# Patient Record
Sex: Female | Born: 1965 | Race: White | Hispanic: No | Marital: Married | Smoking: Never smoker
Health system: Southern US, Community
[De-identification: ages and names within clinical notes are randomized; demographics above are authoritative.]

## PROBLEM LIST (undated history)

## (undated) DIAGNOSIS — Z9289 Personal history of other medical treatment: Secondary | ICD-10-CM

## (undated) DIAGNOSIS — F419 Anxiety disorder, unspecified: Secondary | ICD-10-CM

## (undated) DIAGNOSIS — E7800 Pure hypercholesterolemia, unspecified: Secondary | ICD-10-CM

## (undated) DIAGNOSIS — Z803 Family history of malignant neoplasm of breast: Secondary | ICD-10-CM

## (undated) DIAGNOSIS — N200 Calculus of kidney: Secondary | ICD-10-CM

## (undated) DIAGNOSIS — D649 Anemia, unspecified: Secondary | ICD-10-CM

## (undated) DIAGNOSIS — J189 Pneumonia, unspecified organism: Secondary | ICD-10-CM

## (undated) DIAGNOSIS — I10 Essential (primary) hypertension: Secondary | ICD-10-CM

## (undated) HISTORY — PX: WISDOM TOOTH EXTRACTION: SHX21

## (undated) HISTORY — DX: Anxiety disorder, unspecified: F41.9

## (undated) HISTORY — DX: Pure hypercholesterolemia, unspecified: E78.00

## (undated) HISTORY — DX: Family history of malignant neoplasm of breast: Z80.3

## (undated) HISTORY — DX: Anemia, unspecified: D64.9

## (undated) HISTORY — DX: Calculus of kidney: N20.0

## (undated) HISTORY — DX: Pneumonia, unspecified organism: J18.9

## (undated) HISTORY — DX: Personal history of other medical treatment: Z92.89

---

## 2004-09-18 ENCOUNTER — Ambulatory Visit: Payer: Self-pay

## 2005-09-18 ENCOUNTER — Emergency Department: Payer: Self-pay | Admitting: Emergency Medicine

## 2005-09-23 ENCOUNTER — Ambulatory Visit: Payer: Self-pay

## 2006-01-11 ENCOUNTER — Emergency Department: Payer: Self-pay | Admitting: Emergency Medicine

## 2006-10-21 ENCOUNTER — Ambulatory Visit: Payer: Self-pay

## 2007-11-03 ENCOUNTER — Ambulatory Visit: Payer: Self-pay

## 2008-01-03 ENCOUNTER — Ambulatory Visit: Payer: Self-pay | Admitting: Family Medicine

## 2008-11-07 ENCOUNTER — Ambulatory Visit: Payer: Self-pay

## 2009-11-08 ENCOUNTER — Ambulatory Visit: Payer: Self-pay

## 2010-12-03 ENCOUNTER — Ambulatory Visit: Payer: Self-pay

## 2011-12-11 ENCOUNTER — Ambulatory Visit: Payer: Self-pay

## 2013-01-29 ENCOUNTER — Emergency Department: Payer: Self-pay | Admitting: Emergency Medicine

## 2013-01-29 LAB — COMPREHENSIVE METABOLIC PANEL
ALT: 20 U/L (ref 12–78)
AST: 14 U/L — AB (ref 15–37)
Albumin: 3.9 g/dL (ref 3.4–5.0)
Alkaline Phosphatase: 101 U/L
Anion Gap: 4 — ABNORMAL LOW (ref 7–16)
BUN: 10 mg/dL (ref 7–18)
Bilirubin,Total: 0.5 mg/dL (ref 0.2–1.0)
Calcium, Total: 9.1 mg/dL (ref 8.5–10.1)
Chloride: 105 mmol/L (ref 98–107)
Co2: 29 mmol/L (ref 21–32)
Creatinine: 0.66 mg/dL (ref 0.60–1.30)
EGFR (Non-African Amer.): >60
GLUCOSE: 158 mg/dL — AB (ref 65–99)
OSMOLALITY: 278 (ref 275–301)
POTASSIUM: 3.6 mmol/L (ref 3.5–5.1)
Sodium: 138 mmol/L (ref 136–145)
Total Protein: 7.7 g/dL (ref 6.4–8.2)

## 2013-01-29 LAB — URINALYSIS, COMPLETE
BACTERIA: NONE SEEN
BLOOD: NEGATIVE
Bilirubin,UR: NEGATIVE
Glucose,UR: NEGATIVE mg/dL (ref 0–75)
Ketone: NEGATIVE
Leukocyte Esterase: NEGATIVE
Nitrite: NEGATIVE
Ph: 7 (ref 4.5–8.0)
Protein: NEGATIVE
Specific Gravity: 1.001 (ref 1.003–1.030)
Squamous Epithelial: 3
WBC UR: NONE SEEN /HPF (ref 0–5)

## 2013-01-29 LAB — CBC
HCT: 39.5 % (ref 35.0–47.0)
HGB: 13.6 g/dL (ref 12.0–16.0)
MCH: 31.1 pg (ref 26.0–34.0)
MCHC: 34.3 g/dL (ref 32.0–36.0)
MCV: 91 fL (ref 80–100)
Platelet: 427 10*3/uL (ref 150–440)
RBC: 4.36 10*6/uL (ref 3.80–5.20)
RDW: 12.9 % (ref 11.5–14.5)
WBC: 9.8 10*3/uL (ref 3.6–11.0)

## 2013-01-29 LAB — LIPASE, BLOOD: Lipase: 194 U/L (ref 73–393)

## 2013-11-14 ENCOUNTER — Ambulatory Visit: Payer: Self-pay

## 2014-11-13 ENCOUNTER — Other Ambulatory Visit: Payer: Self-pay | Admitting: Certified Nurse Midwife

## 2014-11-13 DIAGNOSIS — Z1231 Encounter for screening mammogram for malignant neoplasm of breast: Secondary | ICD-10-CM

## 2014-11-16 ENCOUNTER — Ambulatory Visit
Admission: RE | Admit: 2014-11-16 | Discharge: 2014-11-16 | Disposition: A | Discharge status: Home / Self Care | Payer: BC Managed Care – PPO | Source: Ambulatory Visit | Attending: Certified Nurse Midwife | Admitting: Certified Nurse Midwife

## 2014-11-16 DIAGNOSIS — Z1213 Encounter for screening for malignant neoplasm of small intestine: Secondary | ICD-10-CM | POA: Insufficient documentation

## 2014-11-16 DIAGNOSIS — Z1231 Encounter for screening mammogram for malignant neoplasm of breast: Secondary | ICD-10-CM

## 2015-01-20 ENCOUNTER — Emergency Department: Payer: BC Managed Care – PPO

## 2015-01-20 ENCOUNTER — Emergency Department
Admission: EM | Admit: 2015-01-20 | Discharge: 2015-01-20 | Disposition: A | Discharge status: Home / Self Care | Payer: BC Managed Care – PPO | Attending: Emergency Medicine | Admitting: Emergency Medicine

## 2015-01-20 ENCOUNTER — Encounter: Payer: Self-pay | Admitting: *Deleted

## 2015-01-20 DIAGNOSIS — F17200 Nicotine dependence, unspecified, uncomplicated: Secondary | ICD-10-CM | POA: Diagnosis not present

## 2015-01-20 DIAGNOSIS — Z880 Allergy status to penicillin: Secondary | ICD-10-CM | POA: Insufficient documentation

## 2015-01-20 DIAGNOSIS — Z3202 Encounter for pregnancy test, result negative: Secondary | ICD-10-CM | POA: Diagnosis not present

## 2015-01-20 DIAGNOSIS — R109 Unspecified abdominal pain: Secondary | ICD-10-CM | POA: Diagnosis present

## 2015-01-20 DIAGNOSIS — Z79899 Other long term (current) drug therapy: Secondary | ICD-10-CM | POA: Insufficient documentation

## 2015-01-20 DIAGNOSIS — J189 Pneumonia, unspecified organism: Secondary | ICD-10-CM

## 2015-01-20 DIAGNOSIS — Z792 Long term (current) use of antibiotics: Secondary | ICD-10-CM | POA: Insufficient documentation

## 2015-01-20 DIAGNOSIS — N23 Unspecified renal colic: Secondary | ICD-10-CM | POA: Diagnosis not present

## 2015-01-20 DIAGNOSIS — Z793 Long term (current) use of hormonal contraceptives: Secondary | ICD-10-CM | POA: Insufficient documentation

## 2015-01-20 DIAGNOSIS — J159 Unspecified bacterial pneumonia: Secondary | ICD-10-CM | POA: Insufficient documentation

## 2015-01-20 DIAGNOSIS — R52 Pain, unspecified: Secondary | ICD-10-CM

## 2015-01-20 LAB — CBC
HEMATOCRIT: 38.8 % (ref 35.0–47.0)
Hemoglobin: 13.2 g/dL (ref 12.0–16.0)
MCH: 30.1 pg (ref 26.0–34.0)
MCHC: 33.9 g/dL (ref 32.0–36.0)
MCV: 88.8 fL (ref 80.0–100.0)
Platelets: 428 10*3/uL (ref 150–440)
RBC: 4.37 MIL/uL (ref 3.80–5.20)
RDW: 13.4 % (ref 11.5–14.5)
WBC: 13.1 10*3/uL — ABNORMAL HIGH (ref 3.6–11.0)

## 2015-01-20 LAB — URINALYSIS COMPLETE WITH MICROSCOPIC (ARMC ONLY)
BACTERIA UA: NONE SEEN
Bilirubin Urine: NEGATIVE
Glucose, UA: NEGATIVE mg/dL
Ketones, ur: NEGATIVE mg/dL
LEUKOCYTES UA: NEGATIVE
Nitrite: NEGATIVE
PH: 6 (ref 5.0–8.0)
Protein, ur: 30 mg/dL — AB
Specific Gravity, Urine: 1.018 (ref 1.005–1.030)

## 2015-01-20 LAB — COMPREHENSIVE METABOLIC PANEL
ALBUMIN: 4.2 g/dL (ref 3.5–5.0)
ALK PHOS: 78 U/L (ref 38–126)
ALT: 27 U/L (ref 14–54)
AST: 21 U/L (ref 15–41)
Anion gap: 10 (ref 5–15)
BUN: 12 mg/dL (ref 6–20)
CHLORIDE: 107 mmol/L (ref 101–111)
CO2: 22 mmol/L (ref 22–32)
Calcium: 9.1 mg/dL (ref 8.9–10.3)
Creatinine, Ser: 0.68 mg/dL (ref 0.44–1.00)
GFR calc non Af Amer: >60 mL/min (ref >60)
GLUCOSE: 165 mg/dL — AB (ref 65–99)
POTASSIUM: 3.8 mmol/L (ref 3.5–5.1)
SODIUM: 139 mmol/L (ref 135–145)
Total Bilirubin: 0.9 mg/dL (ref 0.3–1.2)
Total Protein: 7.3 g/dL (ref 6.5–8.1)

## 2015-01-20 LAB — PREGNANCY, URINE: PREG TEST UR: NEGATIVE

## 2015-01-20 MED ORDER — HYDROMORPHONE HCL 1 MG/ML IJ SOLN
1.0000 mg | Freq: Once | INTRAMUSCULAR | Status: AC
  Administered 2015-01-20: 1 mg via INTRAVENOUS
  Filled 2015-01-20: qty 1

## 2015-01-20 MED ORDER — SODIUM CHLORIDE 0.9 % IV BOLUS (SEPSIS)
1000.0000 mL | Freq: Once | INTRAVENOUS | Status: AC
  Administered 2015-01-20: 1000 mL via INTRAVENOUS

## 2015-01-20 MED ORDER — ONDANSETRON HCL 4 MG/2ML IJ SOLN
4.0000 mg | Freq: Once | INTRAMUSCULAR | Status: AC
  Administered 2015-01-20: 4 mg via INTRAVENOUS

## 2015-01-20 MED ORDER — ONDANSETRON HCL 4 MG/2ML IJ SOLN
INTRAMUSCULAR | Status: AC
  Administered 2015-01-20: 4 mg via INTRAVENOUS
  Filled 2015-01-20: qty 2

## 2015-01-20 MED ORDER — HYDROMORPHONE HCL 1 MG/ML IJ SOLN
0.5000 mg | Freq: Once | INTRAMUSCULAR | Status: AC
  Administered 2015-01-20: 0.5 mg via INTRAVENOUS

## 2015-01-20 MED ORDER — LEVOFLOXACIN 500 MG PO TABS: 500.0000 mg | ORAL_TABLET | Freq: Every day | ORAL | Status: AC

## 2015-01-20 MED ORDER — KETOROLAC TROMETHAMINE 30 MG/ML IJ SOLN
INTRAMUSCULAR | Status: AC
  Filled 2015-01-20: qty 1

## 2015-01-20 MED ORDER — OXYCODONE-ACETAMINOPHEN 5-325 MG PO TABS
2.0000 | ORAL_TABLET | Freq: Once | ORAL | Status: AC
  Administered 2015-01-20: 2 via ORAL
  Filled 2015-01-20: qty 2

## 2015-01-20 MED ORDER — HYDROMORPHONE HCL 1 MG/ML IJ SOLN
INTRAMUSCULAR | Status: AC
  Administered 2015-01-20: 0.5 mg via INTRAVENOUS
  Filled 2015-01-20: qty 1

## 2015-01-20 MED ORDER — OXYCODONE-ACETAMINOPHEN 5-325 MG PO TABS: 1.0000 | ORAL_TABLET | ORAL | Status: DC | PRN

## 2015-01-20 NOTE — Discharge Instructions (Signed)

## 2015-01-20 NOTE — ED Provider Notes (Signed)
[facility] Emergency Department Provider Note  ____________________________________________  Time seen: Approximately 6:39 AM  I have reviewed the triage vital signs and the nursing notes.   HISTORY  Chief Complaint Flank Pain    HPI Dawn Shelton is a 49 y.o. female patient has a history of kidney stones. Patient was woken from sleep left flank pain radiating from the CVA area down toward the groin consistent with her previous kidney stones. The pain is severe. Patient is nauseated from the pain. Denied any fever.  No past medical history on file.  There are no active problems to display for this patient.   No past surgical history on file.  Current Outpatient Rx  Name  Route  Sig  Dispense  Refill  . acetaminophen (TYLENOL) 500 MG tablet   Oral   Take 500 mg by mouth every 6 (six) hours as needed for mild pain or fever.          . cetirizine (ZYRTEC) 10 MG tablet   Oral   Take 10 mg by mouth daily.         . ferrous sulfate 325 (65 FE) MG tablet   Oral   Take 325 mg by mouth every other day.         . ibuprofen (ADVIL,MOTRIN) 200 MG tablet   Oral   Take 200 mg by mouth every 6 (six) hours as needed for headache.         . levonorgestrel-ethinyl estradiol (NORDETTE) 0.15-30 MG-MCG tablet   Oral   Take 1 tablet by mouth daily.         Marland Kitchen. PARoxetine (PAXIL) 20 MG tablet   Oral   Take 20 mg by mouth daily.         . vitamin B-12 (CYANOCOBALAMIN) 1000 MCG tablet   Oral   Take 1,000 mcg by mouth every other day.         . levofloxacin (LEVAQUIN) 500 MG tablet   Oral   Take 1 tablet (500 mg total) by mouth daily.   7 tablet   0   . oxyCODONE-acetaminophen (ROXICET) 5-325 MG tablet   Oral   Take 1 tablet by mouth every 4 (four) hours as needed for severe pain.   20 tablet   0     Allergies Amoxicillin and Penicillins  Family History  Problem Relation Age of Onset  . Breast cancer Mother [id number]  . Breast cancer  Maternal Aunt [id number]    Social History Social History  Substance Use Topics  . Smoking status: Current Every Day Smoker  . Smokeless tobacco: Not on file  . Alcohol Use: Yes    Review of Systems Constitutional: No fever/chills Eyes: No visual changes. ENT: No sore throat. Cardiovascular: Denies chest pain. Respiratory: Denies shortness of breath. Gastrointestinal: See history of present illness Genitourinary: Negative for dysuria. Musculoskeletal: Negative for back pain. Skin: Negative for rash. Neurological: Negative for headaches, focal weakness or numbness.  10-point ROS otherwise negative.  ____________________________________________   PHYSICAL EXAM:  VITAL SIGNS: ED Triage Vitals  Enc Vitals Group     BP 01/20/15 0449 178/88 mmHg     Pulse Rate 01/20/15 0449 83     Resp 01/20/15 0449 22     Temp 01/20/15 0449 98.3 F (36.8 C)     Temp Source 01/20/15 0449 Oral     SpO2 01/20/15 0449 99 %     Weight 01/20/15 0449 129 lb (58.514 kg)  Height 01/20/15 0449 5' (1.524 m)     Head Cir --      Peak Flow --      Pain Score 01/20/15 0450 10     Pain Loc --      Pain Edu? --      Excl. in GC? --    Constitutional: Alert and oriented. In considerable pain Eyes: Conjunctivae are normal. PERRL. EOMI. Head: Atraumatic. Nose: No congestion/rhinnorhea. Mouth/Throat: Mucous membranes are moist.  Oropharynx non-erythematous. Neck: No stridor.   Cardiovascular: Normal rate, regular rhythm. Grossly normal heart sounds.  Good peripheral circulation. Respiratory: Normal respiratory effort.  No retractions. Lungs CTAB. Gastrointestinal: Soft some tenderness in the left mid abdomen.. No distention. No abdominal bruits. Left CVA tenderness. Musculoskeletal: No lower extremity tenderness nor edema.  No joint effusions. Neurologic:  Normal speech and language. No gross focal neurologic deficits are appreciated. No gait instability. Skin:  Skin is warm, dry and intact. No rash  noted. Psychiatric: Mood and affect are normal. Speech and behavior are normal.  ____________________________________________   LABS (all labs ordered are listed, but only abnormal results are displayed)  Labs Reviewed  COMPREHENSIVE METABOLIC PANEL - Abnormal; Notable for the following:    Glucose, Bld 165 (*)    All other components within normal limits  CBC - Abnormal; Notable for the following:    WBC 13.1 (*)    All other components within normal limits  URINALYSIS COMPLETEWITH MICROSCOPIC ([facility] ONLY) - Abnormal; Notable for the following:    Color, Urine YELLOW (*)    APPearance HAZY (*)    Hgb urine dipstick 3+ (*)    Protein, ur 30 (*)    Squamous Epithelial / LPF 6-30 (*)    All other components within normal limits  PREGNANCY, URINE   ____________________________________________  EKG    ____________________________________________  RADIOLOGY CT shows a 4 mm stone at the UVJ. There is also one other stone in the left kidney. There is an infiltrate in the bottom of the left lung on the CT. Per radiology. Chest x-ray is also read as reticular Angela pattern consistent with pneumonia or bronchiolitis by the radiologist. I reviewed both the CT and the chest x-ray   ____________________________________________   PROCEDURES  Patient gets half a milligram and I wanted which helped slightly in the gets another milligram of Dilaudid which helps a lot .  I discussed the CT and chest x-ray reports with the patient. She will follow-up with both urology and her family doctor. She is aware of the use of the Percocet and Levaquin.  ____________________________________________   INITIAL IMPRESSION / ASSESSMENT AND PLAN / ED COURSE  Pertinent labs & imaging results that were available during my care of the patient were reviewed by me and considered in my medical decision making (see chart for details).  ____________________________________________   FINAL CLINICAL  IMPRESSION(S) / ED DIAGNOSES  Final diagnoses:  Pain  Renal colic  Community acquired pneumonia      Arnaldo Natal, MD 01/20/15 [phone]

## 2015-01-20 NOTE — ED Notes (Signed)
Lab notified to add urine pregnancy. 

## 2015-01-20 NOTE — ED Notes (Signed)
Pt reports pain 2/10 at this time.

## 2015-01-20 NOTE — ED Notes (Signed)
Pt to triage via wheelchair.  Pt has left flank pain with nausea and vomiting.  Hx of kidney stones.  Pt was awakened with pt.

## 2015-01-20 NOTE — ED Notes (Signed)
Returned from CT.

## 2015-10-19 ENCOUNTER — Other Ambulatory Visit: Payer: Self-pay | Admitting: Certified Nurse Midwife

## 2015-10-19 DIAGNOSIS — Z1231 Encounter for screening mammogram for malignant neoplasm of breast: Secondary | ICD-10-CM

## 2015-11-02 ENCOUNTER — Ambulatory Visit
Admission: EM | Admit: 2015-11-02 | Discharge: 2015-11-02 | Disposition: A | Discharge status: Home / Self Care | Payer: BC Managed Care – PPO | Attending: Family Medicine | Admitting: Family Medicine

## 2015-11-02 DIAGNOSIS — I10 Essential (primary) hypertension: Secondary | ICD-10-CM

## 2015-11-02 LAB — BASIC METABOLIC PANEL
ANION GAP: 9 (ref 5–15)
BUN: 13 mg/dL (ref 6–20)
CALCIUM: 9.2 mg/dL (ref 8.9–10.3)
CO2: 24 mmol/L (ref 22–32)
Chloride: 106 mmol/L (ref 101–111)
Creatinine, Ser: 0.54 mg/dL (ref 0.44–1.00)
GFR calc Af Amer: >60 mL/min (ref >60)
GLUCOSE: 93 mg/dL (ref 65–99)
Potassium: 4.6 mmol/L (ref 3.5–5.1)
Sodium: 139 mmol/L (ref 135–145)

## 2015-11-02 MED ORDER — LISINOPRIL 20 MG PO TABS: 20.0000 mg | ORAL_TABLET | Freq: Every day | ORAL | 0 refills | Status: AC

## 2015-11-02 NOTE — ED Provider Notes (Signed)
[facility]    CSN: [id number] Arrival date & time: 11/02/15  0858     History   Chief Complaint Chief Complaint  Patient presents with  . Hypertension    HPI [name] is a 50 y.o. female.   50 yo female with a h/o elevated blood pressure, states PCP was managing with lifestyle modifications, however blood pressures have been running higher lately (systolic 160s-170s) and patient unable to get in with PCP for appointment today. Denies any chest pains, shortness of breath, vision problems.    The history is provided by the patient.    History reviewed. No pertinent past medical history.  There are no active problems to display for this patient.   History reviewed. No pertinent surgical history.  OB History    No data available       Home Medications    Prior to Admission medications   Medication Sig Start Date End Date Taking? Authorizing Provider  acetaminophen (TYLENOL) 500 MG tablet Take 500 mg by mouth every 6 (six) hours as needed for mild pain or fever.    Yes Historical Provider, MD  cetirizine (ZYRTEC) 10 MG tablet Take 10 mg by mouth daily.   Yes Historical Provider, MD  ferrous sulfate 325 (65 FE) MG tablet Take 325 mg by mouth every other day.   Yes Historical Provider, MD  ibuprofen (ADVIL,MOTRIN) 200 MG tablet Take 200 mg by mouth every 6 (six) hours as needed for headache.   Yes Historical Provider, MD  levonorgestrel-ethinyl estradiol (NORDETTE) 0.15-30 MG-MCG tablet Take 1 tablet by mouth daily.   Yes Historical Provider, MD  PARoxetine (PAXIL) 20 MG tablet Take 20 mg by mouth daily.   Yes Historical Provider, MD  vitamin B-12 (CYANOCOBALAMIN) 1000 MCG tablet Take 1,000 mcg by mouth every other day.   Yes Historical Provider, MD  lisinopril (PRINIVIL,ZESTRIL) 20 MG tablet Take 1 tablet (20 mg total) by mouth daily. 11/02/15   Dawn Mccallumrlando Jailan Trimm, MD  oxyCODONE-acetaminophen (ROXICET) 5-325 MG tablet Take 1 tablet by mouth every 4 (four)  hours as needed for severe pain. 01/20/15   Arnaldo NatalPaul F Malinda, MD    Family History Family History  Problem Relation Age of Onset  . Breast cancer Mother [id number]  . Cancer Mother   . Breast cancer Maternal Aunt 45  . Hypertension Father     Social History Social History  Substance Use Topics  . Smoking status: Current Every Day Smoker  . Smokeless tobacco: Never Used  . Alcohol use Yes     Allergies   Amoxicillin and Penicillins   Review of Systems Review of Systems   Physical Exam Triage Vital Signs ED Triage Vitals  Enc Vitals Group     BP 11/02/15 0953 (!) 172/102     Pulse Rate 11/02/15 0953 85     Resp 11/02/15 0953 16     Temp 11/02/15 0953 98.1 F (36.7 C)     Temp Source 11/02/15 0953 Oral     SpO2 11/02/15 0953 100 %     Weight 11/02/15 0953 134 lb (60.8 kg)     Height 11/02/15 0953 5' (1.524 m)     Head Circumference --      Peak Flow --      Pain Score 11/02/15 0955 5     Pain Loc --      Pain Edu? --      Excl. in GC? --    No data found.   Updated  Vital Signs BP (!) 172/102 (BP Location: Left Arm)   Pulse 85   Temp 98.1 F (36.7 C) (Oral)   Resp 16   Ht 5' (1.524 m)   Wt 134 lb (60.8 kg)   LMP 11/02/2015   SpO2 100%   BMI 26.17 kg/m   Visual Acuity Right Eye Distance:   Left Eye Distance:   Bilateral Distance:    Right Eye Near:   Left Eye Near:    Bilateral Near:     Physical Exam  Constitutional: She appears well-developed and well-nourished. No distress.  HENT:  Head: Normocephalic and atraumatic.  Right Ear: Tympanic membrane, external ear and ear canal normal.  Left Ear: Tympanic membrane, external ear and ear canal normal.  Nose: No mucosal edema, rhinorrhea, nose lacerations, sinus tenderness, nasal deformity, septal deviation or nasal septal hematoma. No epistaxis.  No foreign bodies. Right sinus exhibits no maxillary sinus tenderness and no frontal sinus tenderness. Left sinus exhibits no maxillary sinus tenderness and no  frontal sinus tenderness.  Mouth/Throat: Uvula is midline, oropharynx is clear and moist and mucous membranes are normal. No oropharyngeal exudate.  Eyes: Conjunctivae and EOM are normal. Pupils are equal, round, and reactive to light. Right eye exhibits no discharge. Left eye exhibits no discharge. No scleral icterus.  Neck: Normal range of motion. Neck supple. No thyromegaly present.  Cardiovascular: Normal rate, regular rhythm and normal heart sounds.   Pulmonary/Chest: Effort normal and breath sounds normal. No respiratory distress. She has no wheezes. She has no rales.  Lymphadenopathy:    She has no cervical adenopathy.  Skin: She is not diaphoretic.  Nursing note and vitals reviewed.    UC Treatments / Results  Labs (all labs ordered are listed, but only abnormal results are displayed) Labs Reviewed  BASIC METABOLIC PANEL    EKG  EKG Interpretation None       Radiology No results found.  Procedures Procedures (including critical care time)  Medications Ordered in UC Medications - No data to display   Initial Impression / Assessment and Plan / UC Course  I have reviewed the triage vital signs and the nursing notes.  Pertinent labs & imaging results that were available during my care of the patient were reviewed by me and considered in my medical decision making (see chart for details).  Clinical Course      Final Clinical Impressions(s) / UC Diagnoses   Final diagnoses:  Essential hypertension    New Prescriptions Discharge Medication List as of 11/02/2015 10:49 AM    START taking these medications   Details  lisinopril (PRINIVIL,ZESTRIL) 20 MG tablet Take 1 tablet (20 mg total) by mouth daily., Starting Fri 11/02/2015, Normal       1. Lab results and diagnosis reviewed with patient 2. rx as per orders above; reviewed possible side effects, interactions, risks and benefits  3. Recommend supportive treatment with continued lifestyle  modifications 4. Follow-up with PCP in next 2 weeks   Dawn Mccallum, MD 11/02/15 1110

## 2015-11-02 NOTE — ED Triage Notes (Addendum)
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 Denies vision changes however she does have a headache.

## 2015-11-19 ENCOUNTER — Ambulatory Visit
Admission: RE | Admit: 2015-11-19 | Discharge: 2015-11-19 | Disposition: A | Discharge status: Home / Self Care | Payer: BC Managed Care – PPO | Source: Ambulatory Visit | Attending: Certified Nurse Midwife | Admitting: Certified Nurse Midwife

## 2015-11-19 ENCOUNTER — Encounter: Payer: Self-pay | Admitting: Radiology

## 2015-11-19 DIAGNOSIS — Z1231 Encounter for screening mammogram for malignant neoplasm of breast: Secondary | ICD-10-CM | POA: Diagnosis present

## 2015-12-17 ENCOUNTER — Ambulatory Visit
Admission: EM | Admit: 2015-12-17 | Discharge: 2015-12-17 | Disposition: A | Discharge status: Home / Self Care | Payer: BC Managed Care – PPO | Attending: Family Medicine | Admitting: Family Medicine

## 2015-12-17 DIAGNOSIS — R05 Cough: Secondary | ICD-10-CM

## 2015-12-17 DIAGNOSIS — J209 Acute bronchitis, unspecified: Secondary | ICD-10-CM

## 2015-12-17 DIAGNOSIS — R059 Cough, unspecified: Secondary | ICD-10-CM

## 2015-12-17 MED ORDER — AZITHROMYCIN 500 MG PO TABS: ORAL_TABLET | ORAL | 0 refills | Status: DC

## 2015-12-17 MED ORDER — HYDROCOD POLST-CPM POLST ER 10-8 MG/5ML PO SUER: 5.0000 mL | Freq: Two times a day (BID) | ORAL | 0 refills | Status: DC | PRN

## 2015-12-17 MED ORDER — FEXOFENADINE-PSEUDOEPHED ER 180-240 MG PO TB24: 1.0000 | ORAL_TABLET | Freq: Every day | ORAL | 0 refills | Status: DC

## 2015-12-17 NOTE — ED Triage Notes (Signed)
P c/o cough and difficulty taking a deep breath. She mentions she had pnuemonia last year and it feels the same.

## 2015-12-17 NOTE — ED Provider Notes (Signed)
[facility]    CSN: [id number] Arrival date & time: 12/17/15  0806     History   Chief Complaint Chief Complaint  Patient presents with  . Cough    HPI Dawn Shelton is a 50 y.o. female.   Patient's 50 year old white female who states she's been coughing and congestion for about 8 days now. She states that last week Monday or thing started initially was in her nose and sinuses but it has spread. Over the last 2-3 days it feels as if it is settling into her chest. She was concerned because about a year to year and a half ago she had an episode of pneumonia when she was being treated for kidney stones so that she was having early signs of pneumonia. She states she did have a follow-up chest x-ray after that and everything was clear. She denies having a fever this time though and states that when she does cough she is unable to bring anything up. She is coughing through the night and unable to sleep or rest because of the coughing. She does not smoke dip snuff or chew tobacco. According to her chart though she has smoked before. His family history of breast cancer in the mother and hypertension in her father. She is allergic to penicillin she denies any wheezing.   The history is provided by the patient. No language interpreter was used.  Cough  Cough characteristics:  Non-productive, barking and nocturnal Sputum characteristics:  Nondescript Severity:  Moderate Onset quality:  Gradual Duration:  8 days Timing:  Constant Progression:  Worsening Chronicity:  New Smoker: no   Context: upper respiratory infection   Context: not animal exposure, not exposure to allergens and not fumes   Relieved by:  Nothing Worsened by:  Activity and lying down Ineffective treatments:  Rest and cough suppressants Associated symptoms: myalgias and sinus congestion   Associated symptoms: no chest pain, no chills, no fever and no headaches     History reviewed. No pertinent past medical  history.  There are no active problems to display for this patient.   History reviewed. No pertinent surgical history.  OB History    No data available       Home Medications    Prior to Admission medications   Medication Sig Start Date End Date Taking? Authorizing Provider  acetaminophen (TYLENOL) 500 MG tablet Take 500 mg by mouth every 6 (six) hours as needed for mild pain or fever.    Yes Historical Provider, MD  cetirizine (ZYRTEC) 10 MG tablet Take 10 mg by mouth daily.   Yes Historical Provider, MD  ferrous sulfate 325 (65 FE) MG tablet Take 325 mg by mouth every other day.   Yes Historical Provider, MD  ibuprofen (ADVIL,MOTRIN) 200 MG tablet Take 200 mg by mouth every 6 (six) hours as needed for headache.   Yes Historical Provider, MD  levonorgestrel-ethinyl estradiol (NORDETTE) 0.15-30 MG-MCG tablet Take 1 tablet by mouth daily.   Yes Historical Provider, MD  lisinopril (PRINIVIL,ZESTRIL) 20 MG tablet Take 1 tablet (20 mg total) by mouth daily. 11/02/15  Yes Payton Mccallum, MD  PARoxetine (PAXIL) 20 MG tablet Take 20 mg by mouth daily.   Yes Historical Provider, MD  vitamin B-12 (CYANOCOBALAMIN) 1000 MCG tablet Take 1,000 mcg by mouth every other day.   Yes Historical Provider, MD  azithromycin (ZITHROMAX) 500 MG tablet 1 tablet po daily 12/17/15   Hassan Rowan, MD  chlorpheniramine-HYDROcodone [facility]) 10-8 MG/5ML [place]on Kingfisher  Take 5 mLs by mouth every 12 (twelve) hours as needed for cough. 12/17/15   Hassan RowanEugene Adysen Raphael, MD  fexofenadine-pseudoephedrine (ALLEGRA-D ALLERGY & CONGESTION) 180-240 MG 24 hr tablet Take 1 tablet by mouth daily. 12/17/15   Hassan RowanEugene Alashia Brownfield, MD    Family History Family History  Problem Relation Age of Onset  . Breast cancer Mother [id number]  . Cancer Mother   . Breast cancer Maternal Aunt 45  . Hypertension Father     Social History Social History  Substance Use Topics  . Smoking status: Current Every Day Smoker  . Smokeless tobacco: Never Used  .  Alcohol use Yes     Allergies   Amoxicillin and Penicillins   Review of Systems Review of Systems  Constitutional: Negative for chills and fever.  Respiratory: Positive for cough.   Cardiovascular: Negative for chest pain.  Musculoskeletal: Positive for myalgias.  Neurological: Negative for headaches.  All other systems reviewed and are negative.    Physical Exam Triage Vital Signs ED Triage Vitals  Enc Vitals Group     BP 12/17/15 0822 (!) 116/58     Pulse Rate 12/17/15 0822 83     Resp 12/17/15 0822 18     Temp 12/17/15 0822 98.7 F (37.1 C)     Temp Source 12/17/15 0822 Oral     SpO2 12/17/15 0822 99 %     Weight 12/17/15 0823 134 lb (60.8 kg)     Height 12/17/15 0823 5' (1.524 m)     Head Circumference --      Peak Flow --      Pain Score 12/17/15 0824 0     Pain Loc --      Pain Edu? --      Excl. in GC? --    No data found.   Updated Vital Signs BP (!) 116/58 (BP Location: Left Arm)   Pulse 83   Temp 98.7 F (37.1 C) (Oral)   Resp 18   Ht 5' (1.524 m)   Wt 134 lb (60.8 kg)   LMP 09/20/2015 Comment: not preg,not having regular periods  SpO2 99%   BMI 26.17 kg/m   Visual Acuity Right Eye Distance:   Left Eye Distance:   Bilateral Distance:    Right Eye Near:   Left Eye Near:    Bilateral Near:     Physical Exam  Constitutional: She is oriented to person, place, and time. She appears well-developed and well-nourished.  HENT:  Head: Normocephalic and atraumatic.  Right Ear: External ear normal.  Left Ear: External ear normal.  Mouth/Throat: Oropharynx is clear and moist.  Eyes: Conjunctivae and EOM are normal. Pupils are equal, round, and reactive to light.  Neck: Normal range of motion. Neck supple.  Cardiovascular: Normal rate and regular rhythm.   Pulmonary/Chest: Effort normal and breath sounds normal.  Musculoskeletal: Normal range of motion.  Lymphadenopathy:    She has cervical adenopathy.  Neurological: She is alert and oriented  to person, place, and time.  Skin: Skin is warm.  Psychiatric: She has a normal mood and affect. Her behavior is normal.  Vitals reviewed.    UC Treatments / Results  Labs (all labs ordered are listed, but only abnormal results are displayed) Labs Reviewed - No data to display  EKG  EKG Interpretation None       Radiology No results found.  Procedures Procedures (including critical care time)  Medications Ordered in UC Medications - No data to display   Initial  Impression / Assessment and Plan / UC Course  I have reviewed the triage vital signs and the nursing notes.  Pertinent labs & imaging results that were available during my care of the patient were reviewed by me and considered in my medical decision making (see chart for details).  Clinical Course     Explained patient since she is not running a fever cough is really not productive below is similar to when she had pneumonia a year urine half ago I don't expect his pneumonia now. Since she did have a negative follow-up chest x-ray and no fever today not doing another chest x-ray. We'll place on Zithromax 500 mg 1 tablet daily for 5 days test next 1 teaspoon twice a day and Allegra-D 1 tablet daily for the congestion. Work note written for today and tomorrow. Follow-up PCP if not better next 3-4 days.  Final Clinical Impressions(s) / UC Diagnoses   Final diagnoses:  Cough  Acute bronchitis, unspecified organism    New Prescriptions Discharge Medication List as of 12/17/2015  8:44 AM    START taking these medications   Details  azithromycin (ZITHROMAX) 500 MG tablet 1 tablet po daily, Normal    chlorpheniramine-HYDROcodone (TUSSIONEX PENNKINETIC ER) 10-8 MG/5ML SUER Take 5 mLs by mouth every 12 (twelve) hours as needed for cough., Starting Mon 12/17/2015, Normal    fexofenadine-pseudoephedrine (ALLEGRA-D ALLERGY & CONGESTION) 180-240 MG 24 hr tablet Take 1 tablet by mouth daily., Starting Mon 12/17/2015,  Normal         Hassan RowanEugene Owain Eckerman, MD 12/17/15 1049

## 2016-05-01 ENCOUNTER — Other Ambulatory Visit: Payer: Self-pay | Admitting: Certified Nurse Midwife

## 2016-05-01 MED ORDER — NORETHIN ACE-ETH ESTRAD-FE 1-20 MG-MCG(24) PO TABS: 1.0000 | ORAL_TABLET | Freq: Every day | ORAL | 3 refills | Status: DC

## 2016-05-06 IMAGING — CT CT RENAL STONE PROTOCOL
1 of 2 series · 15 of 32 positions shown, 19 images · non-contrast
Comparison: CT scan of January 29, 2013.

CLINICAL DATA: Acute left-sided abdominal pain.

EXAM:
CT ABDOMEN AND PELVIS WITHOUT CONTRAST
TECHNIQUE: Multidetector CT imaging of the abdomen and pelvis was performed
following the standard protocol without IV contrast.

[Series 2: stone standard full · axial · 0.66mm/px · z∈[-408,-23]mm · 15 of 85 slices shown, 19 images]
[im 4/85  soft-tissue]
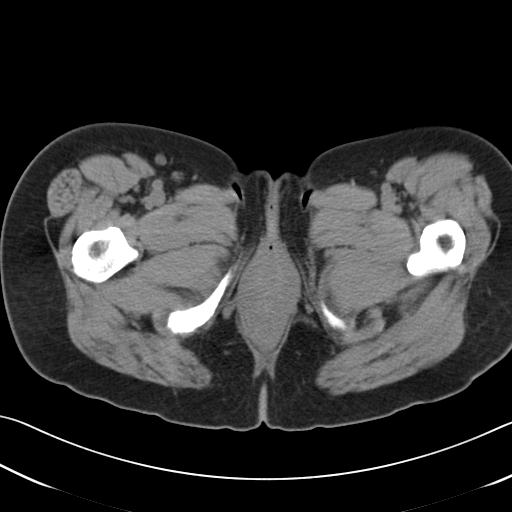
[im 4/85  bone]
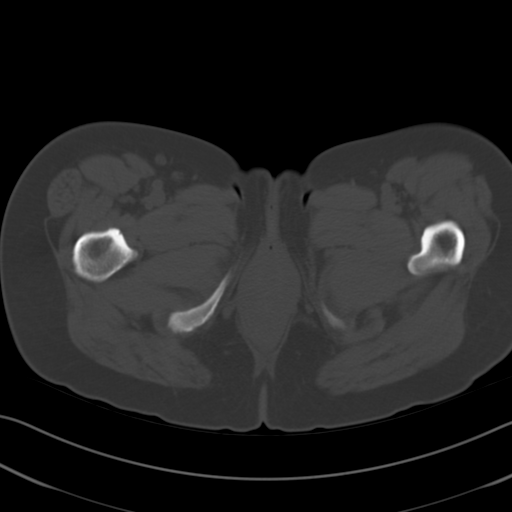
[im 11/85  soft-tissue]
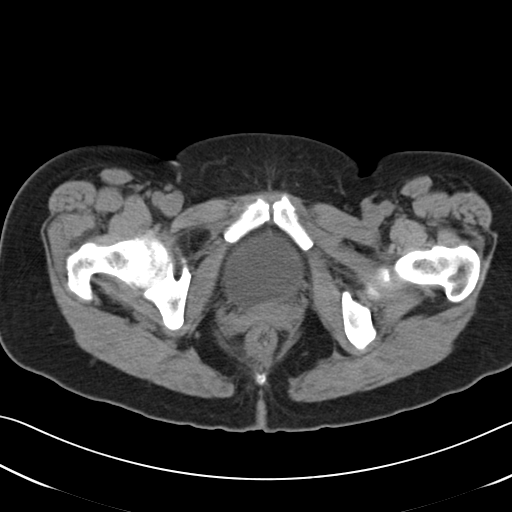
[im 18/85  soft-tissue]
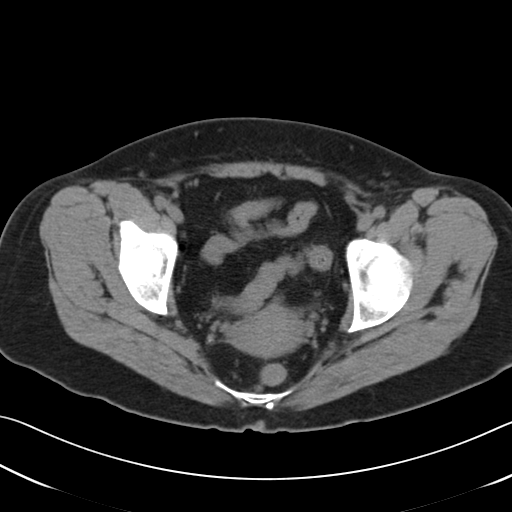
[im 25/85  soft-tissue]
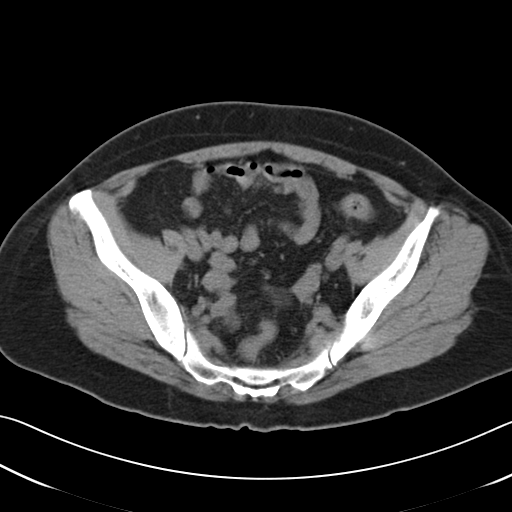
[im 29/85  soft-tissue]
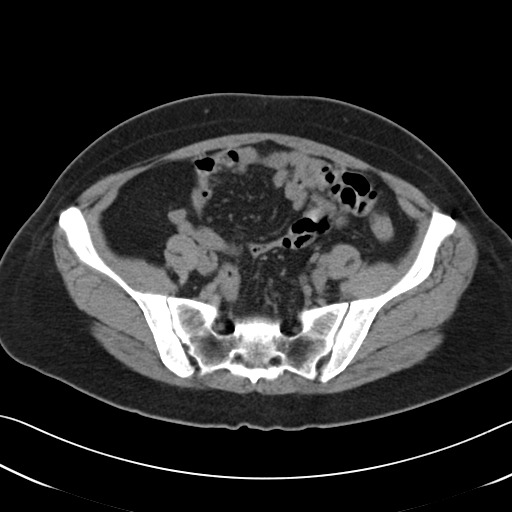
[im 36/85  soft-tissue]
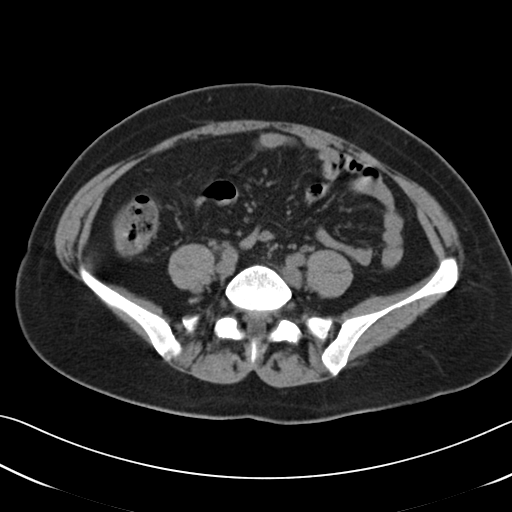
[im 43/85  soft-tissue]
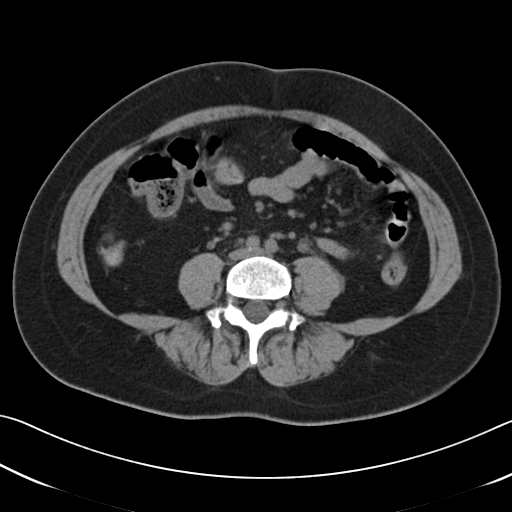
[im 50/85  soft-tissue]
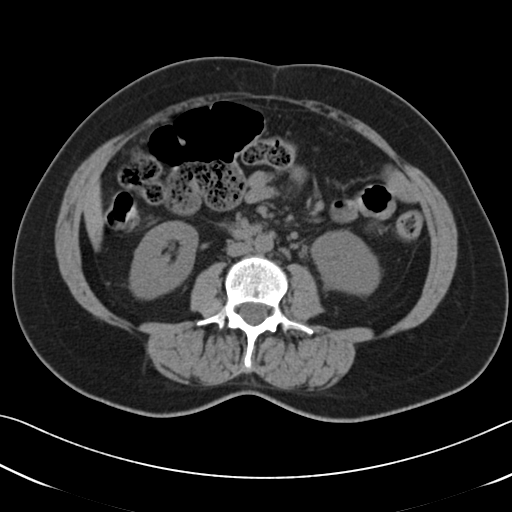
[im 57/85  soft-tissue]
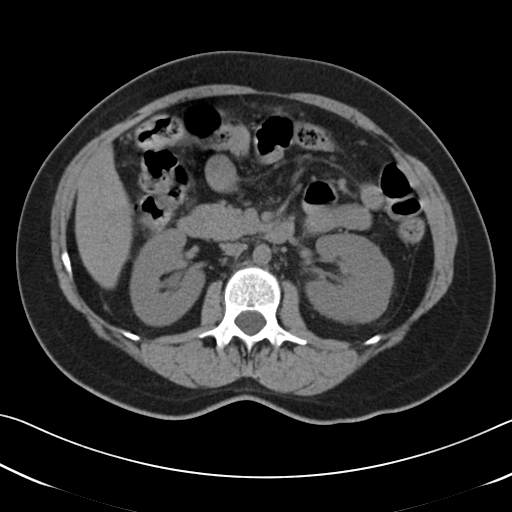
[im 57/85  bone]
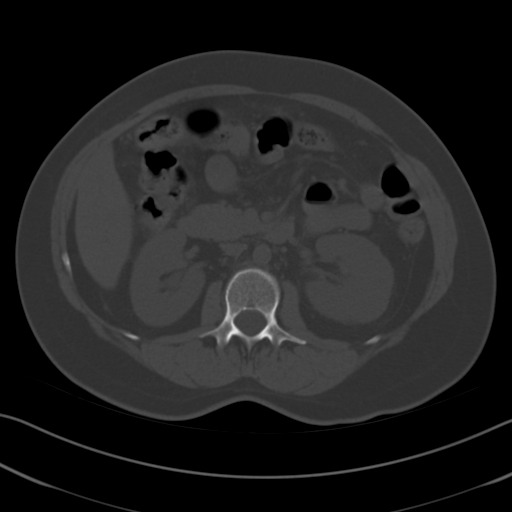
[im 60/85  soft-tissue]
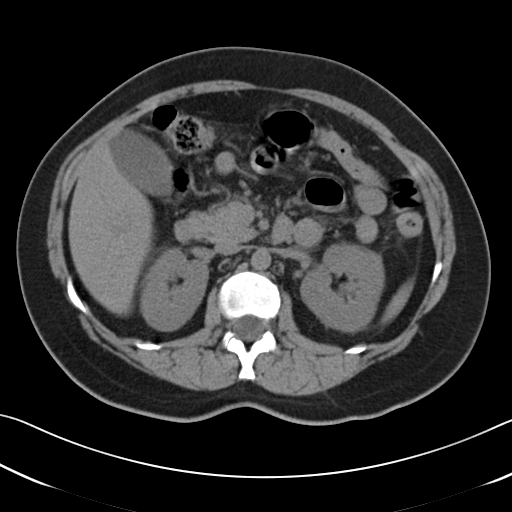
[im 67/85  soft-tissue]
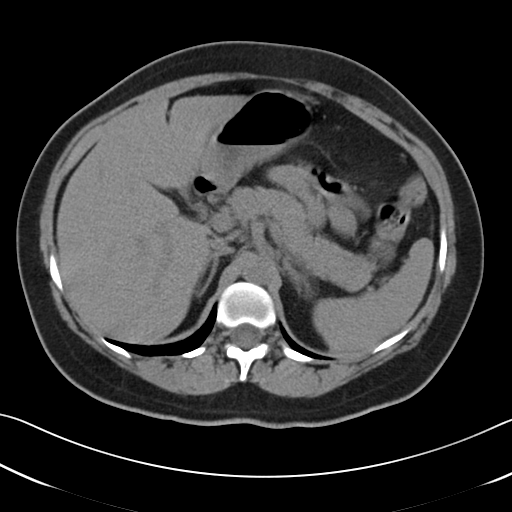
[im 71/85  lung]
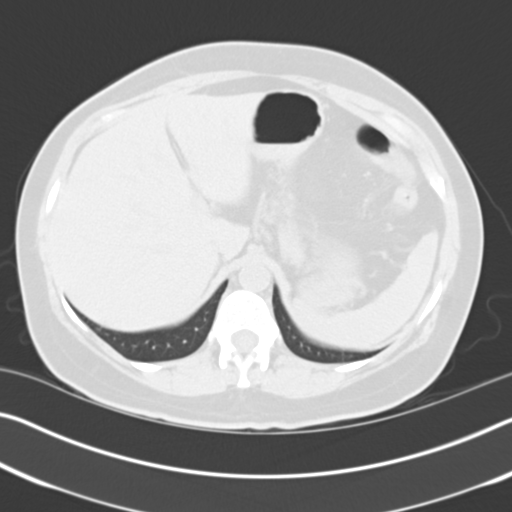
[im 74/85  soft-tissue]
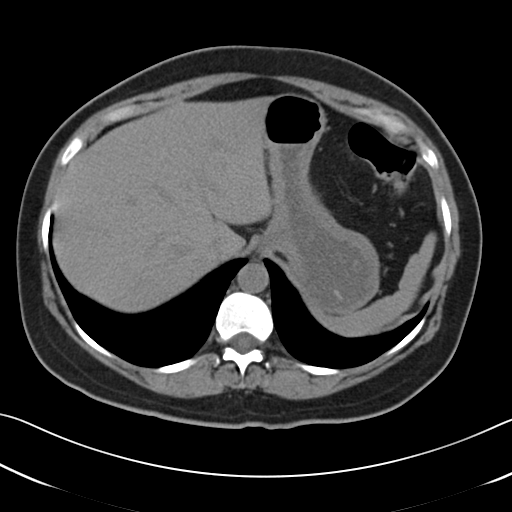
[im 74/85  lung]
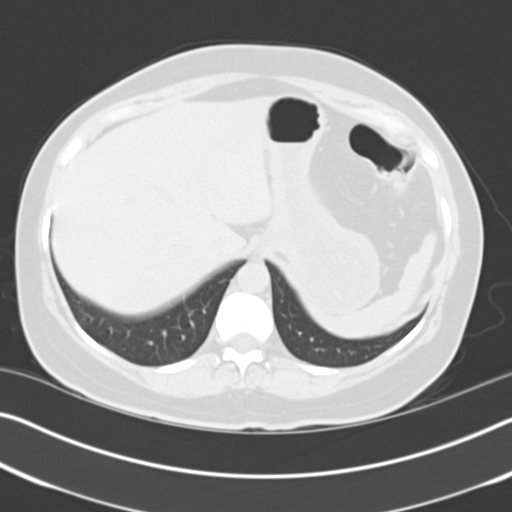
[im 78/85  lung]
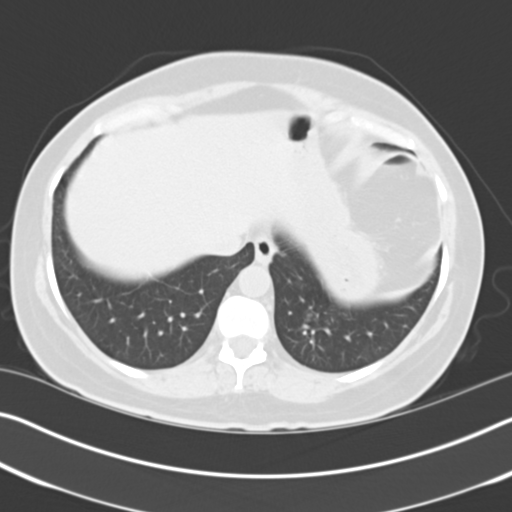
[im 81/85  soft-tissue]
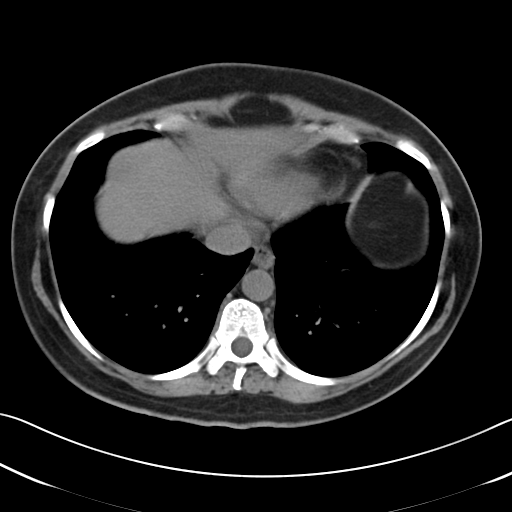
[im 81/85  lung]
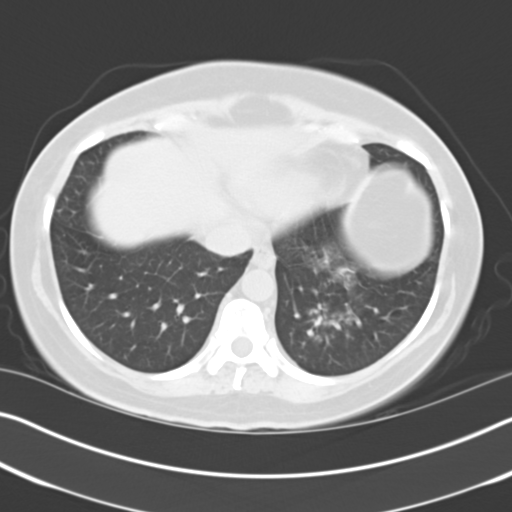

[15 of 32 positions shown; findings below may reference images not displayed]

FINDINGS: There is interval development of multiple inflammatory nodules in
left lung base. No significant osseous abnormality is noted.

No gallstones are noted. No focal abnormality is noted in the liver,
spleen or pancreas on these unenhanced images. Adrenal glands appear
normal. Right kidney and ureter appear normal. Small nonobstructive
calculus is noted in lower pole collecting system of left kidney.
Minimal left hydroureteronephrosis is noted secondary to 4 mm
calculus at left ureterovesical junction. The appendix appears
normal. There is no evidence of bowel obstruction. No abnormal fluid
collection is noted. Urinary bladder appears normal. Uterus and
ovaries appear normal. No significant adenopathy is noted.
IMPRESSION: There is interval development of multiple inflammatory nodules seen
in the left lung base which may represent pneumonia. Continued
radiographic follow-up is recommended.

Small nonobstructive left renal calculus is noted. Minimal left
hydroureteronephrosis is noted secondary to 4 mm left ureterovesical
junction calculus.

## 2016-10-17 ENCOUNTER — Other Ambulatory Visit: Payer: Self-pay | Admitting: Certified Nurse Midwife

## 2016-10-17 ENCOUNTER — Other Ambulatory Visit: Payer: Self-pay | Admitting: Family Medicine

## 2016-10-17 DIAGNOSIS — Z1231 Encounter for screening mammogram for malignant neoplasm of breast: Secondary | ICD-10-CM

## 2016-11-19 ENCOUNTER — Ambulatory Visit
Admission: RE | Admit: 2016-11-19 | Discharge: 2016-11-19 | Disposition: A | Discharge status: Home / Self Care | Payer: BC Managed Care – PPO | Source: Ambulatory Visit | Attending: Family Medicine | Admitting: Family Medicine

## 2016-11-19 DIAGNOSIS — Z1231 Encounter for screening mammogram for malignant neoplasm of breast: Secondary | ICD-10-CM | POA: Diagnosis not present

## 2016-12-20 HISTORY — PX: COLONOSCOPY: SHX174

## 2016-12-23 ENCOUNTER — Encounter: Payer: Self-pay | Admitting: *Deleted

## 2016-12-23 ENCOUNTER — Ambulatory Visit
Admission: EM | Admit: 2016-12-23 | Discharge: 2016-12-23 | Disposition: A | Discharge status: Home / Self Care | Payer: BC Managed Care – PPO | Attending: Family Medicine | Admitting: Family Medicine

## 2016-12-23 DIAGNOSIS — R51 Headache: Secondary | ICD-10-CM | POA: Diagnosis not present

## 2016-12-23 DIAGNOSIS — R519 Headache, unspecified: Secondary | ICD-10-CM

## 2016-12-23 HISTORY — DX: Essential (primary) hypertension: I10

## 2016-12-23 MED ORDER — ONDANSETRON 8 MG PO TBDP
8.0000 mg | ORAL_TABLET | Freq: Once | ORAL | Status: AC
  Administered 2016-12-23: 8 mg via ORAL

## 2016-12-23 MED ORDER — AZITHROMYCIN 250 MG PO TABS: ORAL_TABLET | ORAL | 0 refills | Status: DC

## 2016-12-23 NOTE — ED Triage Notes (Signed)
Headache x4 days with N/V, left ear pain, facial pain. Seen by PCP yesterday and dx migraine. Pt feels there is a sinus problem. Denies fever.

## 2016-12-23 NOTE — Discharge Instructions (Signed)
If no improvement in 1-2 days, please get re-evaluated.  Take care  Dr. Adriana Simasook

## 2016-12-23 NOTE — ED Provider Notes (Addendum)
[facility]    CSN: [id number] Arrival date & time: 12/23/16  0831  History   Chief Complaint Chief Complaint  Patient presents with  . Headache  . Otalgia  . Nausea   HPI  51 year old female presents with the above complaints.  Patient states that she has had a severe headache since Friday.  She states that she has been nauseated.  Especially with movement.  Headache is located in the maxillary region and frontal region as well as the top of the head.  She reports photophobia.  No vomiting.  Improves with lying down and not moving.  She thinks that she is recently had a GI illness as well.  Patient was seen by her primary yesterday.  Was diagnosed with acute migraine.  Patient was concern for sinusitis but her primary states that this seems to be related to migraine.  She was treated with Imitrex and prednisone.  She states that she has had no improvement.  The patient feels that she has a sinus infection.  She states that she has had these for years.  She reports congestion, facial pain.  Heightened sense of smell. No other complaints at this time.  Past Medical History:  Diagnosis Date  . Hypertension    History reviewed. No pertinent surgical history.  OB History    No data available     Home Medications    Prior to Admission medications   Medication Sig Start Date End Date Taking? Authorizing Provider  cetirizine (ZYRTEC) 10 MG tablet Take 10 mg by mouth daily.   Yes [provider]  cholecalciferol (VITAMIN D) 1000 units tablet Take 2,000 Units by mouth daily.   Yes [provider]  ibuprofen (ADVIL,MOTRIN) 200 MG tablet Take 200 mg by mouth every 6 (six) hours as needed for headache.   Yes [provider]  lisinopril (PRINIVIL,ZESTRIL) 20 MG tablet Take 1 tablet (20 mg total) by mouth daily. 11/02/15  Yes Conty, Pamala Hurryrlando, MD  Norethindrone Acetate-Ethinyl Estrad-FE (BLISOVI 24 FE) 1-20 MG-MCG(24) tablet Take 1 tablet by mouth daily.  05/01/16  Yes Farrel ConnersGutierrez, Colleen, CNM  PARoxetine (PAXIL) 20 MG tablet Take 20 mg by mouth daily.   Yes [provider]  predniSONE (DELTASONE) 10 MG tablet Take 10 mg by mouth daily with breakfast.   Yes [provider]  SUMAtriptan (IMITREX) 100 MG tablet Take 100 mg by mouth every 2 (two) hours as needed for migraine. May repeat in 2 hours if headache persists or recurs.   Yes [provider]  vitamin B-12 (CYANOCOBALAMIN) 1000 MCG tablet Take 1,000 mcg by mouth every other day.   Yes [provider]  acetaminophen (TYLENOL) 500 MG tablet Take 500 mg by mouth every 6 (six) hours as needed for mild pain or fever.     [provider]  azithromycin (ZITHROMAX) 250 MG tablet 2 tablets on Day 1, then 1 tablet daily on Days 2-5. 12/23/16   Tommie Samsook, Adley Mazurowski G, DO    Family History Family History  Problem Relation Age of Onset  . Breast cancer Mother 1952  . Cancer Mother   . Diabetes Mother   . Cirrhosis Mother   . Breast cancer Maternal Aunt 45  . Hypertension Father   . Heart failure Father   . Diabetes Father     Social History Social History   Tobacco Use  . Smoking status: Never Smoker  . Smokeless tobacco: Never Used  Substance Use Topics  . Alcohol use: Yes  .  Drug use: No   Allergies   Amoxicillin and Penicillins   Review of Systems Review of Systems  HENT: Positive for congestion and ear pain.        Facial pain.  Eyes: Positive for photophobia.  Gastrointestinal: Positive for nausea.  Neurological: Positive for headaches.   Physical Exam Triage Vital Signs ED Triage Vitals  Enc Vitals Group     BP 12/23/16 0856 125/68     Pulse Rate 12/23/16 0856 83     Resp 12/23/16 0856 16     Temp 12/23/16 0856 98.6 F (37 C)     Temp Source 12/23/16 0856 Oral     SpO2 12/23/16 0856 98 %     Weight 12/23/16 0858 128 lb (58.1 kg)     Height 12/23/16 0858 5' (1.524 m)     Head Circumference --      Peak Flow --      Pain Score  12/23/16 0859 7     Pain Loc --      Pain Edu? --      Excl. in GC? --    No data found.  Updated Vital Signs BP 125/68 (BP Location: Left Arm)   Pulse 83   Temp 98.6 F (37 C) (Oral)   Resp 16   Ht 5' (1.524 m)   Wt 128 lb (58.1 kg)   SpO2 98%   BMI 25.00 kg/m    Physical Exam  Constitutional: She is oriented to person, place, and time. She appears well-developed. No distress.  HENT:  Head: Normocephalic and atraumatic.  Mouth/Throat: Oropharynx is clear and moist.  TMs normal bilaterally. She was severe maxillary sinus tenderness to palpation bilaterally.  Also with frontal pain with palpation.  Eyes: Conjunctivae and EOM are normal. Pupils are equal, round, and reactive to light. Right eye exhibits no discharge. Left eye exhibits no discharge.  Neck: Neck supple.  Cardiovascular: Normal rate and regular rhythm.  2/6 systolic murmur.  Pulmonary/Chest: Effort normal and breath sounds normal. She has no wheezes. She has no rales.  Lymphadenopathy:    She has no cervical adenopathy.  Neurological: She is alert and oriented to person, place, and time.  Normal cranial nerve testing.  Normal muscle strength.  Normal tone.  Normal cerebellar testing.  Psychiatric: She has a normal mood and affect. Her behavior is normal.  Vitals reviewed.  UC Treatments / Results  Labs (all labs ordered are listed, but only abnormal results are displayed) Labs Reviewed - No data to display  EKG  EKG Interpretation None       Radiology No results found.  Procedures Procedures (including critical care time)  Medications Ordered in UC Medications  ondansetron (ZOFRAN-ODT) disintegrating tablet 8 mg (8 mg Oral Given 12/23/16 0855)     Initial Impression / Assessment and Plan / UC Course  I have reviewed the triage vital signs and the nursing notes.  Pertinent labs & imaging results that were available during my care of the patient were reviewed by me and considered in my medical  decision making (see chart for details).     51 year old female presents with headache, facial pain, congestion. Her primary care physician thought that this was migraine.  She has had no improvement with the treatment.  Possible sinusitis but very atypical.  Given her persistent symptoms, I am going to treat her empirically with antibiotic.  She states she has had a good response to azithromycin in the past.  We will proceed with  this.  I advised her that her story is very atypical.  If she does not improve or worsens in the next 24-48 hours, she should be reevaluated.  At that time I would recommend imaging and/or referral to neurology.  Final Clinical Impressions(s) / UC Diagnoses   Final diagnoses:  Facial pain  Acute intractable headache, unspecified headache type    ED Discharge Orders        Ordered    azithromycin (ZITHROMAX) 250 MG tablet     12/23/16 0928     Controlled Substance Prescriptions [place] Controlled Substance Registry consulted? Not Applicable   Tommie SamsCook, Kue Fox G, DO 12/23/16 [id number]    Tommie Samsook, Arvel Oquinn G, DO 12/23/16 [phone]

## 2016-12-23 NOTE — ED Triage Notes (Signed)
Hx of sinus infections.

## 2017-03-12 ENCOUNTER — Ambulatory Visit: Payer: Self-pay | Admitting: Certified Nurse Midwife

## 2017-03-27 ENCOUNTER — Ambulatory Visit: Payer: Self-pay | Admitting: Certified Nurse Midwife

## 2017-04-11 ENCOUNTER — Other Ambulatory Visit: Payer: Self-pay | Admitting: Certified Nurse Midwife

## 2017-04-23 ENCOUNTER — Other Ambulatory Visit: Payer: Self-pay | Admitting: Certified Nurse Midwife

## 2017-07-28 ENCOUNTER — Ambulatory Visit (INDEPENDENT_AMBULATORY_CARE_PROVIDER_SITE_OTHER): Payer: BC Managed Care – PPO | Admitting: Certified Nurse Midwife

## 2017-07-28 ENCOUNTER — Encounter: Payer: Self-pay | Admitting: Certified Nurse Midwife

## 2017-07-28 ENCOUNTER — Other Ambulatory Visit (HOSPITAL_COMMUNITY)
Admission: RE | Admit: 2017-07-28 | Discharge: 2017-07-28 | Disposition: A | Discharge status: Home / Self Care | Payer: BC Managed Care – PPO | Source: Ambulatory Visit | Attending: Obstetrics and Gynecology | Admitting: Obstetrics and Gynecology

## 2017-07-28 VITALS — BP 100/60 | HR 84 | Ht 60.0 in | Wt 131.0 lb

## 2017-07-28 DIAGNOSIS — I10 Essential (primary) hypertension: Secondary | ICD-10-CM | POA: Insufficient documentation

## 2017-07-28 DIAGNOSIS — Z1231 Encounter for screening mammogram for malignant neoplasm of breast: Secondary | ICD-10-CM

## 2017-07-28 DIAGNOSIS — Z01419 Encounter for gynecological examination (general) (routine) without abnormal findings: Secondary | ICD-10-CM | POA: Insufficient documentation

## 2017-07-28 DIAGNOSIS — E7800 Pure hypercholesterolemia, unspecified: Secondary | ICD-10-CM | POA: Insufficient documentation

## 2017-07-28 DIAGNOSIS — Z803 Family history of malignant neoplasm of breast: Secondary | ICD-10-CM

## 2017-07-28 DIAGNOSIS — F419 Anxiety disorder, unspecified: Secondary | ICD-10-CM | POA: Insufficient documentation

## 2017-07-28 DIAGNOSIS — J189 Pneumonia, unspecified organism: Secondary | ICD-10-CM | POA: Insufficient documentation

## 2017-07-28 DIAGNOSIS — Z1239 Encounter for other screening for malignant neoplasm of breast: Secondary | ICD-10-CM

## 2017-07-28 DIAGNOSIS — Z124 Encounter for screening for malignant neoplasm of cervix: Secondary | ICD-10-CM

## 2017-07-28 DIAGNOSIS — N200 Calculus of kidney: Secondary | ICD-10-CM | POA: Insufficient documentation

## 2017-07-28 NOTE — Progress Notes (Signed)
Gynecology Annual Exam  PCP: [facility]  Chief Complaint:  Chief Complaint  Patient presents with  . Gynecologic Exam    History of Present Illness:Dawn Shelton is a 52 year old Caucasian/White female , G 1 P 1 0 0 1 , who presents for her annual exam . She is having no significant GYN problems. She is moving to [place] soon and this is probably her last annual exam here at [facility] Her menses are absent. She was amenorrheic on her Microgestin 24, prior to running out of pills earlier this year. She has had no bleeding or spotting since stopping the pills  The patient's past medical history is notable for a strong family hx of breast cancer in her mother and maternal aunt. Lifetime risk>25% despite negative HBOC testing. She has had counseling by [facility] and she declines MRIs or prophyllactic mastectomy. Last mammogram was 11/19/2016 and was negative. Her medical history is also remarkable for mild hypertension ( just started on lisinopril 1-2 years ago), allergic rhinitis and anxiety. Her mother is living with her and has just been diagnosed with non alcoholic cirrhosis  She is not currently sexually active.  Her most recent pap smear was obtained 03/07/2016 and was NIL/negative HRHPV   There is a positive history of breast cancer in her mother and maternal aunt. Genetic testing has been done. The patient tested negative for BRCA 1, negative for BRCA 2, and negative for BART.  There is no family history of ovarian cancer.  The patient does do self breast exams. She had a colonoscopy 01/09/2017 that was normal. Next due in 10 years. The patient does not smoke.  The patient does drink alcohol occasionally  The patient does not use illegal drugs.  The patient exercises by walking her dogs three times a day.   She had a recent cholesterol screen in 2018 that was borderline     Review of Systems: Review of Systems  Constitutional: Negative for chills, fever  and weight loss.  HENT: Negative for congestion, sinus pain and sore throat.   Eyes: Negative for blurred vision and pain.  Respiratory: Negative for hemoptysis, shortness of breath and wheezing.   Cardiovascular: Negative for chest pain, palpitations and leg swelling.  Gastrointestinal: Negative for abdominal pain, blood in stool, diarrhea, heartburn, nausea and vomiting.  Genitourinary: Negative for dysuria, frequency, hematuria and urgency.       Positive for amenorrhea  Musculoskeletal: Negative for back pain, joint pain and myalgias.  Skin: Negative for itching and rash.  Neurological: Negative for dizziness, tingling and headaches.  Endo/Heme/Allergies: Negative for environmental allergies and polydipsia. Does not bruise/bleed easily.       Negative for hirsutism   Psychiatric/Behavioral: Negative for depression. The patient is nervous/anxious. The patient does not have insomnia.     Past Medical History:  Past Medical History:  Diagnosis Date  . Anemia   . Anxiety   . Family history of breast cancer    mother, mat. aunt; BRCA neg 10/18/10 I and II, BART also  . History of mammogram 11/16/14; 11/19/15   neg; neg;   . Hypercholesterolemia   . Hypertension   . Kidney stones   . Pneumonia     Past Surgical History:  Past Surgical History:  Procedure Laterality Date  . COLONOSCOPY  12/2016   normal  . WISDOM TOOTH EXTRACTION     TWO TAKEN OUT    Family History:  Family History  Problem  Relation Age of Onset  . Breast cancer Mother 70  . Diabetes Mother   . Cirrhosis Mother        non etoh cirrhosis of liver  . Breast cancer Maternal Aunt 43  . Hypertension Father   . Heart failure Father   . Diabetes Father   . Other Sister        BREAST CYST  . Other Other        KIDNEY PROBLEMS - IGA 2013    Social History:  Social History   Socioeconomic History  . Marital status: Married    Spouse name: Joneen Caraway  . Number of children: 1  . Years of education: 51  .  Highest education level: Not on file  Occupational History  . Occupation: Retired Pharmacist, hospital    Comment: [employer]  Social Needs  . Financial resource strain: Not on file  . Food insecurity:    Worry: Not on file    Inability: Not on file  . Transportation needs:    Medical: Not on file    Non-medical: Not on file  Tobacco Use  . Smoking status: Never Smoker  . Smokeless tobacco: Never Used  Substance and Sexual Activity  . Alcohol use: Yes    Comment: RARE  . Drug use: No  . Sexual activity: Not Currently    Birth control/protection: Post-menopausal  Lifestyle  . Physical activity:    Days per week: Not on file    Minutes per session: Not on file  . Stress: Not on file  Relationships  . Social connections:    Talks on phone: Not on file    Gets together: Not on file    Attends religious service: Not on file    Active member of club or organization: Not on file    Attends meetings of clubs or organizations: Not on file    Relationship status: Not on file  . Intimate partner violence:    Fear of current or ex partner: Not on file    Emotionally abused: Not on file    Physically abused: Not on file    Forced sexual activity: Not on file  Other Topics Concern  . Not on file  Social History Narrative  . Not on file    Allergies:  Allergies  Allergen Reactions  . Amoxicillin Hives and Itching  . Penicillins Rash, Hives and Itching    Has patient had a PCN reaction causing immediate rash, facial/tongue/throat swelling, SOB or lightheadedness with hypotension: Yes Has patient had a PCN reaction causing severe rash involving mucus membranes or skin necrosis: No Has patient had a PCN reaction that required hospitalization No Has patient had a PCN reaction occurring within the last 10 years: No If all of the above answers are "NO", then may proceed with Cephalosporin use.    Medications:  Current Outpatient Medications on File Prior to Visit  Medication Sig Dispense Refill    . acetaminophen (TYLENOL) 500 MG tablet Take 500 mg by mouth every 6 (six) hours as needed for mild pain or fever.     . busPIRone (BUSPAR) 15 MG tablet Take 1 tablet by mouth 2 (two) times daily.  3  . cetirizine (ZYRTEC) 10 MG tablet Take 10 mg by mouth daily.    . cholecalciferol (VITAMIN D) 1000 units tablet Take 2,000 Units by mouth daily.    . cyanocobalamin ([employer] VITAMIN B12) 2000 MCG tablet Take 1 tablet by mouth daily.    Marland Kitchen ibuprofen (ADVIL,MOTRIN) 200  MG tablet Take 200 mg by mouth every 6 (six) hours as needed for headache.    . lisinopril (PRINIVIL,ZESTRIL) 20 MG tablet Take 1 tablet (20 mg total) by mouth daily. 15 tablet 0  . PARoxetine (PAXIL) 20 MG tablet Take 20 mg by mouth daily.     No current facility-administered medications on file prior to visit.        Physical Exam Vitals: BP 100/60   Pulse 84   Ht 5' (1.524 m)   Wt 131 lb (59.4 kg)   BMI 25.58 kg/m   General: WF in NAD HEENT: normocephalic, anicteric Neck: no thyroid enlargement, no palpable nodules, no cervical lymphadenopathy  Pulmonary: No increased work of breathing, CTAB Cardiovascular: RRR, without murmur  Breast: Breast symmetrical, no tenderness, no palpable nodules or masses, no skin or nipple retraction present, no nipple discharge.  No axillary, infraclavicular or supraclavicular lymphadenopathy. Abdomen: Soft, non-tender, non-distended.  Umbilicus without lesions.  No hepatomegaly or masses palpable. No evidence of hernia. Genitourinary:  External: Normal external female genitalia.  Normal urethral meatus, normal Bartholin's and Skene's  glands.    Vagina: Normal vaginal mucosa, no evidence of prolapse.    Cervix: large eversion, soft,no bleeding, non-tender  Uterus: Retroverted, normal size, shape, and consistency, mobile, and non-tender  Adnexa: No adnexal masses, non-tender  Rectal: deferred  Lymphatic: no evidence of inguinal lymphadenopathy Extremities: no edema, erythema, or  tenderness Neurologic: Grossly intact Psychiatric: mood appropriate, affect full     Assessment: 52 y.o. G1P1001 well woman gyn  exam Family history of breast cancer  Plan:   1) Breast cancer screening - recommend monthly self breast exam and annual screening mammograms. Mammogram was ordered today. Patient to schedule for after 11/19/2017. Has been offered updated MyRISK testing, MRIs etc.  2) Colon cancer screening is UTD  3) Cervical cancer screening - Pap was done.   4) Contraception - Theoretically should use contraception until no menses x 1 year when off pills  5) Routine healthcare maintenance including cholesterol and diabetes screening managed by PCP   Dalia Heading, CNM

## 2017-07-30 LAB — CYTOLOGY - PAP: Diagnosis: NEGATIVE

## 2017-08-12 ENCOUNTER — Telehealth: Payer: Self-pay

## 2017-08-12 NOTE — Telephone Encounter (Signed)
Pt aware of negative pap.

## 2017-08-12 NOTE — Telephone Encounter (Signed)
Pt had pap done on 07/28/17, calling for results
